# Patient Record
Sex: Male | Born: 1985 | Race: Black or African American | Hispanic: No | Marital: Married | Smoking: Current every day smoker
Health system: Southern US, Community
[De-identification: ages and names within clinical notes are randomized; demographics above are authoritative.]

## PROBLEM LIST (undated history)

## (undated) HISTORY — PX: APPENDECTOMY: SHX54

---

## 2020-06-27 ENCOUNTER — Emergency Department (HOSPITAL_COMMUNITY)
Admission: EM | Admit: 2020-06-27 | Discharge: 2020-06-28 | Disposition: A | Discharge status: Home / Self Care | Payer: BC Managed Care – PPO | Attending: Emergency Medicine | Admitting: Emergency Medicine

## 2020-06-27 ENCOUNTER — Emergency Department (HOSPITAL_COMMUNITY): Payer: BC Managed Care – PPO

## 2020-06-27 ENCOUNTER — Encounter (HOSPITAL_COMMUNITY): Payer: Self-pay | Admitting: Emergency Medicine

## 2020-06-27 DIAGNOSIS — M25511 Pain in right shoulder: Secondary | ICD-10-CM | POA: Diagnosis present

## 2020-06-27 DIAGNOSIS — Z5321 Procedure and treatment not carried out due to patient leaving prior to being seen by health care provider: Secondary | ICD-10-CM | POA: Diagnosis not present

## 2020-06-27 NOTE — ED Triage Notes (Signed)
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

## 2020-06-28 ENCOUNTER — Encounter (HOSPITAL_COMMUNITY): Payer: Self-pay | Admitting: *Deleted

## 2020-06-28 ENCOUNTER — Emergency Department (HOSPITAL_COMMUNITY)
Admission: EM | Admit: 2020-06-28 | Discharge: 2020-06-28 | Disposition: A | Discharge status: Home / Self Care | Payer: BC Managed Care – PPO | Source: Home / Self Care | Attending: Emergency Medicine | Admitting: Emergency Medicine

## 2020-06-28 ENCOUNTER — Other Ambulatory Visit: Payer: Self-pay

## 2020-06-28 DIAGNOSIS — M25511 Pain in right shoulder: Secondary | ICD-10-CM

## 2020-06-28 DIAGNOSIS — M79601 Pain in right arm: Secondary | ICD-10-CM | POA: Insufficient documentation

## 2020-06-28 DIAGNOSIS — F17200 Nicotine dependence, unspecified, uncomplicated: Secondary | ICD-10-CM | POA: Insufficient documentation

## 2020-06-28 MED ORDER — NAPROXEN 250 MG PO TABS: 500.0000 mg | ORAL_TABLET | Freq: Two times a day (BID) | ORAL | 0 refills | Status: DC

## 2020-06-28 MED ORDER — LIDOCAINE 5 % EX PTCH
1.0000 | MEDICATED_PATCH | CUTANEOUS | Status: DC
  Administered 2020-06-28: 1 via TRANSDERMAL
  Filled 2020-06-28: qty 1

## 2020-06-28 MED ORDER — NAPROXEN 500 MG PO TABS
500.0000 mg | ORAL_TABLET | Freq: Once | ORAL | Status: AC
  Administered 2020-06-28: 500 mg via ORAL
  Filled 2020-06-28: qty 1

## 2020-06-28 MED ORDER — NAPROXEN 250 MG PO TABS: 500.0000 mg | ORAL_TABLET | Freq: Two times a day (BID) | ORAL | 0 refills | Status: AC

## 2020-06-28 NOTE — Discharge Instructions (Signed)
Take naproxen twice daily as directed with food on your stomach, in addition to this you can use Tylenol 1000 mg every 8 hours.  You can also use over-the-counter salon pas Lidoderm patches, which can be worn for up to 12 hours and then should be removed for 12 hours.  If your symptoms or not improving over the next 5 to 7 days you should follow-up with orthopedics or sports medicine for further evaluation.  Avoid heavy lifting while at work.

## 2020-06-28 NOTE — ED Provider Notes (Signed)
[facility]-EMERGENCY DEPT Provider Note   CSN: [id number] Arrival date & time: 06/28/20  1218     History Chief Complaint  Patient presents with  . Shoulder Pain    right    Karma Hiney is a 34 y.o. male.  Jeremias Broyhill is a 34 y.o. male who is otherwise healthy, presents to the ED for evaluation of right shoulder pain.  He reports pain started on Monday and has been persistent over the past 3 days.  He denies any injury or trauma to the shoulder but does state that he does a lot of heavy lifting at work and thinks he may have injured it this way.  He states pain is worse when he tries to lift the shoulder overhead, and it is often worse at night when he is sleeping.  He denies any pain radiating into the arm, reports pain is located over the front of the shoulder, no associated neck pain.  No numbness tingling or weakness in the arm.  No pain at the wrist or elbow.  He has not taken anything to treat symptoms.  Denies history of previous injury or surgery to the right shoulder.  No other aggravating or alleviating factors.        History reviewed. No pertinent past medical history.  There are no problems to display for this patient.   Past Surgical History:  Procedure Laterality Date  . APPENDECTOMY         No family history on file.  Social History   Tobacco Use  . Smoking status: Current Every Day Smoker  . Smokeless tobacco: Never Used  Substance Use Topics  . Alcohol use: Not Currently  . Drug use: Not on file    Home Medications Prior to Admission medications   Medication Sig Start Date End Date Taking? Authorizing Provider  naproxen (NAPROSYN) 250 MG tablet Take 2 tablets (500 mg total) by mouth 2 (two) times daily with a meal for 14 days. 06/28/20 07/12/20  Dartha Lodge, PA-C    Allergies    Patient has no known allergies.  Review of Systems   Review of Systems  Constitutional: Negative for chills and fever.  Musculoskeletal:  Positive for arthralgias. Negative for joint swelling.  Skin: Negative for color change and rash.  Neurological: Negative for weakness and numbness.    Physical Exam Updated Vital Signs BP 116/76 (BP Location: Left Arm)   Pulse 94   Temp 98.5 F (36.9 C) (Oral)   Resp 18   SpO2 94%   Physical Exam Vitals and nursing note reviewed.  Constitutional:      General: He is not in acute distress.    Appearance: Normal appearance. He is well-developed and normal weight. He is not ill-appearing or diaphoretic.  HENT:     Head: Normocephalic and atraumatic.  Eyes:     General:        Right eye: No discharge.        Left eye: No discharge.  Neck:     Comments: No midline or paraspinal tenderness Pulmonary:     Effort: Pulmonary effort is normal. No respiratory distress.  Musculoskeletal:     Cervical back: Neck supple. No tenderness.     Comments: Tenderness to palpation over the anterior portion of the right shoulder without palpable deformity, swelling or overlying skin changes.  No tenderness over the trapezius, deltoid or extending down into the arm.  Patient has normal range of motion at the elbow and  wrist.  Range of motion of the shoulder limited by pain, primarily when reaching overhead, but he has full passive range of motion.  Distal pulses 2+, normal strength and sensation.  Skin:    General: Skin is warm and dry.  Neurological:     Mental Status: He is alert and oriented to person, place, and time.     Coordination: Coordination normal.  Psychiatric:        Mood and Affect: Mood normal.        Behavior: Behavior normal.     ED Results / Procedures / Treatments   Labs (all labs ordered are listed, but only abnormal results are displayed) Labs Reviewed - No data to display  EKG None  Radiology DG Shoulder Right  Result Date: 06/27/2020 CLINICAL DATA:  History of prior injury with waxing and waning pain, initial encounter EXAM: RIGHT SHOULDER - 2+ VIEW COMPARISON:   None. FINDINGS: No acute fracture or dislocation is noted. No soft tissue abnormality is seen. Underlying bony thorax appears within normal limits. IMPRESSION: No acute abnormality noted. Electronically Signed   By: Alcide Clever M.D.   On: 06/27/2020 23:20    Procedures Procedures (including critical care time)  Medications Ordered in ED Medications  lidocaine (LIDODERM) 5 % 1 patch (1 patch Transdermal Patch Applied 06/28/20 1314)  naproxen (NAPROSYN) tablet 500 mg (500 mg Oral Given 06/28/20 1314)    ED Course  I have reviewed the triage vital signs and the nursing notes.  Pertinent labs & imaging results that were available during my care of the patient were reviewed by me and considered in my medical decision making (see chart for details).    MDM Rules/Calculators/A&P                          34 year old male presents with right shoulder pain, no specific trauma or injury but does a lot of heavy lifting at work, came to [facility] and had an x-ray done but did not stay for full evaluation, x-rays reviewed, no evidence of fracture or dislocation.  Patient has no deformity to the shoulder today and is neurovascularly intact.  Suspect rotator cuff tendinopathy.  Will have patient treat with NSAIDs and Lidoderm patches and avoid heavy lifting and have him follow-up with orthopedics or sports medicine if symptoms or not improving.  Patient expresses understanding and agreement.  Discharged home in good condition.  Final Clinical Impression(s) / ED Diagnoses Final diagnoses:  Acute pain of right shoulder    Rx / DC Orders ED Discharge Orders         Ordered    naproxen (NAPROSYN) 250 MG tablet  2 times daily with meals     Discontinue  Reprint     06/28/20 1314           Dartha Lodge, [place] 06/28/20 1315    Linwood Dibbles, MD 06/29/20 1002

## 2020-06-28 NOTE — ED Triage Notes (Signed)
Pt complains of right shoulder pain x 3 days. Denies injury. States he lifts heavy things at work.

## 2020-10-27 ENCOUNTER — Emergency Department (HOSPITAL_COMMUNITY)
Admission: EM | Admit: 2020-10-27 | Discharge: 2020-10-27 | Disposition: A | Discharge status: Home / Self Care | Payer: BC Managed Care – PPO | Attending: Emergency Medicine | Admitting: Emergency Medicine

## 2020-10-27 ENCOUNTER — Other Ambulatory Visit: Payer: Self-pay

## 2020-10-27 ENCOUNTER — Encounter (HOSPITAL_COMMUNITY): Payer: Self-pay | Admitting: Emergency Medicine

## 2020-10-27 DIAGNOSIS — K0889 Other specified disorders of teeth and supporting structures: Secondary | ICD-10-CM | POA: Insufficient documentation

## 2020-10-27 DIAGNOSIS — F17200 Nicotine dependence, unspecified, uncomplicated: Secondary | ICD-10-CM | POA: Diagnosis not present

## 2020-10-27 MED ORDER — PENICILLIN V POTASSIUM 500 MG PO TABS: 500.0000 mg | ORAL_TABLET | Freq: Four times a day (QID) | ORAL | 0 refills | Status: AC

## 2020-10-27 NOTE — ED Provider Notes (Signed)
[facility]-EMERGENCY DEPT Provider Note   CSN: [id number] Arrival date & time: 10/27/20  1659     History Chief Complaint  Patient presents with   Dental Pain    Jeffery Mills is a 34 y.o. male.  HPI   Patient with no significant medical history presents to the emergency department with chief complaint of right-sided lower jaw dental pain.  Patient states pain started  2 days ago and has been getting worse. Patient noted a small bump in the back of his mouth and that it was painful when he touches it.  He states he is not seen a dentist in a long time,   concerned that he might have a abscess in his mouth.  He denies fevers, chills, tongue or throat swelling, difficulty swallowing or breathing.  He is not immunocompromise has not taken any antibiotics for the for this.  He has been taking Tylenol and ibuprofen without relief.  Patient denies alleviating factors at this time.  Patient denies headaches, ear pain, trismus or torticollis,  fevers, chills, shortness of breath, chest pain, abdominal pain, nausea vomiting diarrhea, pedal edema.  History reviewed. No pertinent past medical history.  There are no problems to display for this patient.   Past Surgical History:  Procedure Laterality Date   APPENDECTOMY         No family history on file.  Social History   Tobacco Use   Smoking status: Current Every Day Smoker   Smokeless tobacco: Never Used  Substance Use Topics   Alcohol use: Not Currently   Drug use: Not on file    Home Medications Prior to Admission medications   Medication Sig Start Date End Date Taking? Authorizing Provider  penicillin v potassium (VEETID) 500 MG tablet Take 1 tablet (500 mg total) by mouth 4 (four) times daily for 7 days. 10/27/20 11/03/20  Carroll Sage, PA-C    Allergies    Patient has no known allergies.  Review of Systems   Review of Systems  Constitutional: Negative for chills and fever.  HENT:  Positive for dental problem. Negative for congestion, ear discharge and sore throat.   Respiratory: Negative for shortness of breath.   Cardiovascular: Negative for chest pain.  Gastrointestinal: Negative for abdominal pain, diarrhea, nausea and vomiting.  Genitourinary: Negative for enuresis.  Musculoskeletal: Negative for back pain.  Skin: Negative for rash.  Neurological: Negative for dizziness and headaches.  Hematological: Does not bruise/bleed easily.    Physical Exam Updated Vital Signs BP 126/81 (BP Location: Left Arm)    Pulse 86    Temp 99.7 F (37.6 C) (Oral)    Resp 16    SpO2 100%   Physical Exam Vitals and nursing note reviewed.  Constitutional:      General: He is not in acute distress.    Appearance: He is not ill-appearing.  HENT:     Head: Normocephalic and atraumatic.     Nose: No congestion.     Mouth/Throat:     Mouth: Mucous membranes are moist.     Pharynx: No oropharyngeal exudate or posterior oropharyngeal erythema.     Comments: Oropharynx was visualized tongue and uvula are both midline, he had noted multiple cavities throughout his molars.  Patient's right lower molar has some erythema noted, no notable abscess.  Gumline was palpated no fluctuance or indurations felt.  No trismus noted Eyes:     Conjunctiva/sclera: Conjunctivae normal.  Neck:     Comments: No torticollis Cardiovascular:  Rate and Rhythm: Normal rate and regular rhythm.     Pulses: Normal pulses.     Heart sounds: No murmur heard.  No friction rub. No gallop.   Pulmonary:     Effort: Pulmonary effort is normal.  Musculoskeletal:     Cervical back: Normal range of motion and neck supple.     Comments: Patient moving all 4 extremities out difficulty  Skin:    General: Skin is warm and dry.  Neurological:     Mental Status: He is alert.     Comments: No difficulty with word finding  Psychiatric:        Mood and Affect: Mood normal.     ED Results / Procedures / Treatments    Labs (all labs ordered are listed, but only abnormal results are displayed) Labs Reviewed - No data to display  EKG None  Radiology No results found.  Procedures Procedures (including critical care time)  Medications Ordered in ED Medications - No data to display  ED Course  I have reviewed the triage vital signs and the nursing notes.  Pertinent labs & imaging results that were available during my care of the patient were reviewed by me and considered in my medical decision making (see chart for details).    MDM Rules/Calculators/A&P                          Patient presents with dental pain.  He is alert, does not appear in acute distress, vital signs reassuring.  Due to well-appearing patient, benign physical exam further lab work and imaging not warranted at this time.  I have low suspicion for peritonsillar abscess, retropharyngeal abscess, or Ludwig angina as oropharynx was visualized tongue and uvula were both midline, there is no exudates, erythema or edema noted in the posterior pillars or on/ around tonsils.  Low suspicion for an abscess as gumline were palpated no fluctuance or induration felt.  Low suspicion for periorbital or orbital cellulitis as patient face had no erythematous.  Suspect patient suffering from dental cavity, will start him on antibiotics and refer him to a dentist for further evaluation.  Vital signs have remained stable, no indication for hospital admission.Patient given at home care as well strict return precautions.  Patient verbalized that they understood agreed to said plan.   Final Clinical Impression(s) / ED Diagnoses Final diagnoses:  Pain, dental    Rx / DC Orders ED Discharge Orders         Ordered    penicillin v potassium (VEETID) 500 MG tablet  4 times daily        10/27/20 1828           Carroll Sage, PA-C 10/27/20 1937    Terald Sleeper, MD 10/27/20 2239

## 2020-10-27 NOTE — Discharge Instructions (Signed)
Seen here for dental pain.  Exam looks reassuring.  Starting on antibiotics please take as prescribed.  May take over-the-counter pain medications like ibuprofen or Tylenol every 6 hours as needed please follow dosing the back of bottle.  Recommend that you follow-up with a dentist for further evaluation.  Come back to the emergency department if you develop chest pain, shortness of breath, severe abdominal pain, uncontrolled nausea, vomiting, diarrhea.

## 2020-10-27 NOTE — ED Triage Notes (Signed)
Patient c/o right lower dental pain x2 days.

## 2021-07-05 ENCOUNTER — Emergency Department (HOSPITAL_COMMUNITY): Payer: Self-pay

## 2021-07-05 ENCOUNTER — Other Ambulatory Visit: Payer: Self-pay

## 2021-07-05 ENCOUNTER — Encounter (HOSPITAL_COMMUNITY): Payer: Self-pay

## 2021-07-05 ENCOUNTER — Emergency Department (HOSPITAL_COMMUNITY)
Admission: EM | Admit: 2021-07-05 | Discharge: 2021-07-06 | Disposition: A | Discharge status: Home / Self Care | Payer: Self-pay | Attending: Emergency Medicine | Admitting: Emergency Medicine

## 2021-07-05 DIAGNOSIS — R1030 Lower abdominal pain, unspecified: Secondary | ICD-10-CM | POA: Insufficient documentation

## 2021-07-05 DIAGNOSIS — F17200 Nicotine dependence, unspecified, uncomplicated: Secondary | ICD-10-CM | POA: Insufficient documentation

## 2021-07-05 DIAGNOSIS — R109 Unspecified abdominal pain: Secondary | ICD-10-CM

## 2021-07-05 DIAGNOSIS — N50811 Right testicular pain: Secondary | ICD-10-CM | POA: Insufficient documentation

## 2021-07-05 LAB — CBC WITH DIFFERENTIAL/PLATELET
Abs Immature Granulocytes: 0.02 10*3/uL (ref 0.00–0.07)
Basophils Absolute: 0 10*3/uL (ref 0.0–0.1)
Basophils Relative: 1 %
Eosinophils Absolute: 0.2 10*3/uL (ref 0.0–0.5)
Eosinophils Relative: 2 %
HCT: 40.6 % (ref 39.0–52.0)
Hemoglobin: 13.7 g/dL (ref 13.0–17.0)
Immature Granulocytes: 0 %
Lymphocytes Relative: 21 %
Lymphs Abs: 1.7 10*3/uL (ref 0.7–4.0)
MCH: 31.4 pg (ref 26.0–34.0)
MCHC: 33.7 g/dL (ref 30.0–36.0)
MCV: 92.9 fL (ref 80.0–100.0)
Monocytes Absolute: 0.7 10*3/uL (ref 0.1–1.0)
Monocytes Relative: 8 %
Neutro Abs: 5.6 10*3/uL (ref 1.7–7.7)
Neutrophils Relative %: 68 %
Platelets: 167 10*3/uL (ref 150–400)
RBC: 4.37 MIL/uL (ref 4.22–5.81)
RDW: 14.1 % (ref 11.5–15.5)
WBC: 8.3 10*3/uL (ref 4.0–10.5)
nRBC: 0 % (ref 0.0–0.2)

## 2021-07-05 LAB — COMPREHENSIVE METABOLIC PANEL
ALT: 18 U/L (ref 0–44)
AST: 25 U/L (ref 15–41)
Albumin: 4.5 g/dL (ref 3.5–5.0)
Alkaline Phosphatase: 55 U/L (ref 38–126)
Anion gap: 6 (ref 5–15)
BUN: 16 mg/dL (ref 6–20)
CO2: 28 mmol/L (ref 22–32)
Calcium: 9.5 mg/dL (ref 8.9–10.3)
Chloride: 104 mmol/L (ref 98–111)
Creatinine, Ser: 1.02 mg/dL (ref 0.61–1.24)
GFR, Estimated: >60 mL/min (ref >60)
Glucose, Bld: 108 mg/dL — ABNORMAL HIGH (ref 70–99)
Potassium: 4.1 mmol/L (ref 3.5–5.1)
Sodium: 138 mmol/L (ref 135–145)
Total Bilirubin: 0.6 mg/dL (ref 0.3–1.2)
Total Protein: 7.6 g/dL (ref 6.5–8.1)

## 2021-07-05 LAB — URINALYSIS, ROUTINE W REFLEX MICROSCOPIC
Bacteria, UA: NONE SEEN
Bilirubin Urine: NEGATIVE
Glucose, UA: NEGATIVE mg/dL
Ketones, ur: NEGATIVE mg/dL
Leukocytes,Ua: NEGATIVE
Nitrite: NEGATIVE
Protein, ur: NEGATIVE mg/dL
Specific Gravity, Urine: 1.023 (ref 1.005–1.030)
pH: 6 (ref 5.0–8.0)

## 2021-07-05 LAB — LIPASE, BLOOD: Lipase: 23 U/L (ref 11–51)

## 2021-07-05 NOTE — ED Provider Notes (Signed)
Emergency Medicine Provider Triage Evaluation Note  Jeffery Mills , a 35 y.o. male  was evaluated in triage.  Pt complains of R testicle pain and R groin pain since this morning. Was lifting heavy objects yesterday, no penile lesions, discharge. No urinary symptoms. No nausea, vomting, fevers.   Review of Systems  Positive: Testicle pain Negative: No nausea, vomting, fevers.   Physical Exam  BP 123/80 (BP Location: Left Arm)   Pulse 76   Temp 98.3 F (36.8 C) (Oral)   Resp 18   SpO2 99%  Gen:   Awake, no distress   Resp:  Normal effort  MSK:   Moves extremities without difficulty  Other:  L testicle slighty tender to touch, not swollen   Medical Decision Making  Medically screening exam initiated at 5:31 PM.  Appropriate orders placed.  Salvatore Shear was informed that the remainder of the evaluation will be completed by another provider, this initial triage assessment does not replace that evaluation, and the importance of remaining in the ED until their evaluation is complete.     Farrel Gordon, PA-C 07/05/21 1732    Alvira Monday, MD 07/07/21 [phone]

## 2021-07-05 NOTE — ED Triage Notes (Signed)
Right groin and scrotum pain x 1 day with increased pain to touch. Pt reports lifting mattress yesterday. Hx of appendicectomy. Denies n/v/fever

## 2021-07-06 ENCOUNTER — Emergency Department (HOSPITAL_COMMUNITY): Payer: Self-pay

## 2021-07-06 MED ORDER — ONDANSETRON HCL 4 MG/2ML IJ SOLN
4.0000 mg | Freq: Once | INTRAMUSCULAR | Status: AC
  Administered 2021-07-06: 4 mg via INTRAVENOUS
  Filled 2021-07-06: qty 2

## 2021-07-06 MED ORDER — KETOROLAC TROMETHAMINE 30 MG/ML IJ SOLN
30.0000 mg | Freq: Once | INTRAMUSCULAR | Status: AC
  Administered 2021-07-06: 30 mg via INTRAVENOUS
  Filled 2021-07-06: qty 1

## 2021-07-06 NOTE — ED Provider Notes (Signed)
[facility]-EMERGENCY DEPT Provider Note   CSN: [id number] Arrival date & time: 07/05/21  1701     History Chief Complaint  Patient presents with   Testicle Pain    Rio Kidane is a 35 y.o. male.  Patient presents to the emergency department with a chief complaint of right-sided abdominal and right-sided groin pain.  States symptoms started yesterday morning.  He reports that the symptoms wax and wane.  He denies any dysuria, hematuria, penile discharge.  He denies any nausea, vomiting, fevers, or chills.  He states that the pain seems to radiate from his abdomen down into his groin and testicle.  Denies any history of kidney stones.  Rates his pain as moderate.  The history is provided by the patient. No language interpreter was used.      History reviewed. No pertinent past medical history.  There are no problems to display for this patient.   Past Surgical History:  Procedure Laterality Date   APPENDECTOMY         History reviewed. No pertinent family history.  Social History   Tobacco Use   Smoking status: Every Day   Smokeless tobacco: Never  Substance Use Topics   Alcohol use: Not Currently    Home Medications Prior to Admission medications   Not on File    Allergies    Patient has no known allergies.  Review of Systems   Review of Systems  All other systems reviewed and are negative.  Physical Exam Updated Vital Signs BP 127/90   Pulse 66   Temp 98.3 F (36.8 C) (Oral)   Resp 16   SpO2 100%   Physical Exam Vitals and nursing note reviewed.  Constitutional:      General: He is not in acute distress.    Appearance: He is well-developed. He is not ill-appearing.  HENT:     Head: Normocephalic and atraumatic.  Eyes:     Conjunctiva/sclera: Conjunctivae normal.  Cardiovascular:     Rate and Rhythm: Normal rate.  Pulmonary:     Effort: Pulmonary effort is normal. No respiratory distress.  Abdominal:     General:  There is no distension.  Musculoskeletal:     Cervical back: Neck supple.     Comments: Moves all extremities  Skin:    General: Skin is warm and dry.  Neurological:     Mental Status: He is alert and oriented to person, place, and time.  Psychiatric:        Mood and Affect: Mood normal.        Behavior: Behavior normal.    ED Results / Procedures / Treatments   Labs (all labs ordered are listed, but only abnormal results are displayed) Labs Reviewed  COMPREHENSIVE METABOLIC PANEL - Abnormal; Notable for the following components:      Result Value   Glucose, Bld 108 (*)    All other components within normal limits  URINALYSIS, ROUTINE W REFLEX MICROSCOPIC - Abnormal; Notable for the following components:   Hgb urine dipstick SMALL (*)    All other components within normal limits  CBC WITH DIFFERENTIAL/PLATELET  LIPASE, BLOOD  GC/CHLAMYDIA PROBE AMP () NOT AT [facility]    EKG None  Radiology US SCROTUM W/DOPPLER  Result Date: 07/05/2021 CLINICAL DATA:  Testicular pain EXAM: SCROTAL ULTRASOUND DOPPLER ULTRASOUND OF THE TESTICLES TECHNIQUE: Complete ultrasound examination of the testicles, epididymis, and other scrotal structures was performed. Color and spectral Doppler ultrasound were also utilized to evaluate blood  flow to the testicles. COMPARISON:  None. FINDINGS: Right testicle Measurements: 4.3 x 1.8 x 3.0 cm. No mass or microlithiasis visualized. Left testicle Measurements: 4.1 x 1.7 x 2.7 cm. No mass or microlithiasis visualized. Right epididymis:  Normal in size and appearance. Left epididymis:  Normal in size and appearance. Hydrocele:  Small right hydrocele Varicocele:  None visualized. Pulsed Doppler interrogation of both testes demonstrates normal low resistance arterial and venous waveforms bilaterally. IMPRESSION: No testicular abnormality or evidence of torsion. Small right hydrocele. Electronically Signed   By: Charlett Nose M.D.   On: 07/05/2021 18:54     Procedures Procedures   Medications Ordered in ED Medications  ketorolac (TORADOL) 30 MG/ML injection 30 mg (has no administration in time range)  ondansetron (ZOFRAN) injection 4 mg (has no administration in time range)    ED Course  I have reviewed the triage vital signs and the nursing notes.  Pertinent labs & imaging results that were available during my care of the patient were reviewed by me and considered in my medical decision making (see chart for details).    MDM Rules/Calculators/A&P                           Patient here with right-sided abdominal pain.  Pain radiates into his scrotum.  Patient seen by provider in triage.  Had ultrasound done in triage showed no evidence of testicular torsion.  There was a small right-sided hydrocele.  Urinalysis shows few red blood cells and small hemoglobin, question kidney stone.  Will check CT renal.  No leukocytosis, normal creatinine.  CT renal shows no acute abnormalities.  Patient feels somewhat improved.  The vital signs are stable.  He is nontoxic in appearance.  Do not feel that further emergent work-up is indicated.  We will have patient follow-up with his doctor for further outpatient work-up.  Patient understands agrees with the plan.  He is stable ready for discharge. Final Clinical Impression(s) / ED Diagnoses Final diagnoses:  Flank pain    Rx / DC Orders ED Discharge Orders     None        Roxy Horseman, PA-C 07/06/21 0214    Jacalyn Lefevre, MD 07/06/21 [phone]

## 2021-07-08 LAB — GC/CHLAMYDIA PROBE AMP (~~LOC~~) NOT AT ARMC
Chlamydia: NEGATIVE
Comment: NEGATIVE
Comment: NORMAL
Neisseria Gonorrhea: NEGATIVE

## 2022-02-04 IMAGING — US US SCROTUM W/ DOPPLER COMPLETE
1 series · 15 of 25 positions shown · non-contrast
Comparison: None.

CLINICAL DATA: Testicular pain

EXAM:
SCROTAL ULTRASOUND
DOPPLER ULTRASOUND OF THE TESTICLES
TECHNIQUE: Complete ultrasound examination of the testicles, epididymis, and
other scrotal structures was performed. Color and spectral Doppler
ultrasound were also utilized to evaluate blood flow to the
testicles.

[Series 1: us art/ven flow abd pelv doppl mc & wl · 59 acquisitions, 15 frames shown]
[im 1/59]
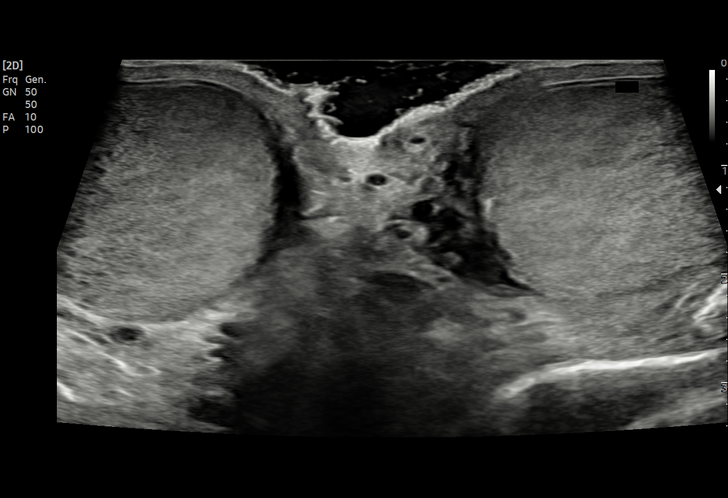
[im 5/59]
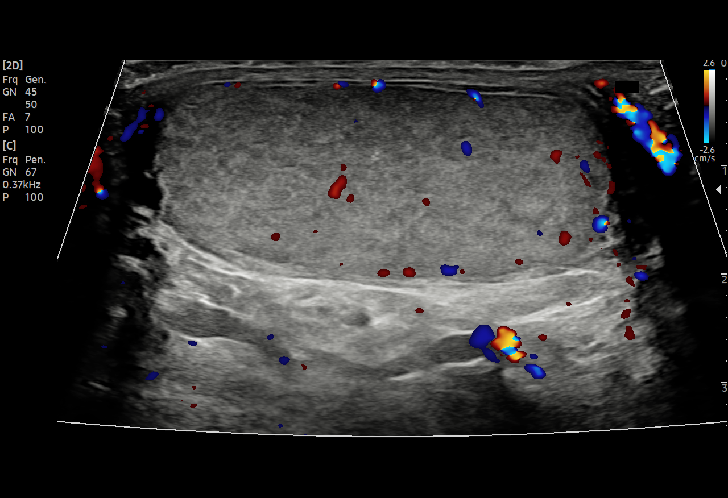
[im 10/59]
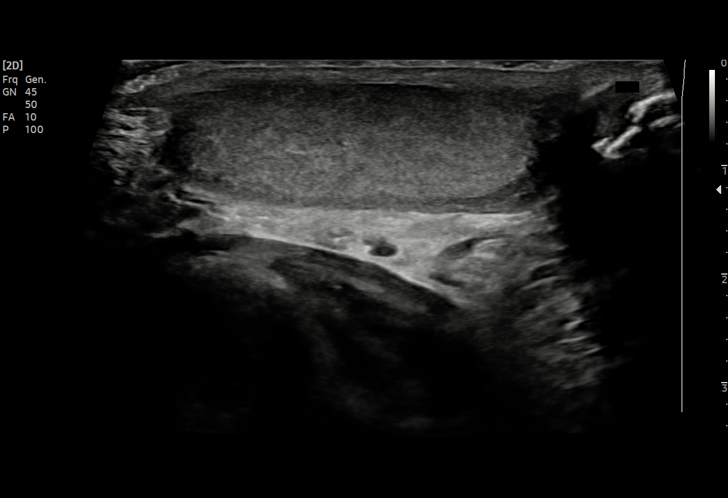
[im 13/59]
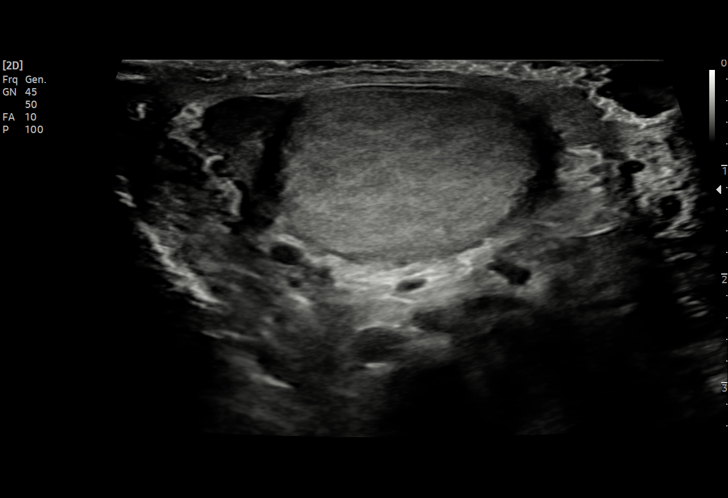
[im 17/59]
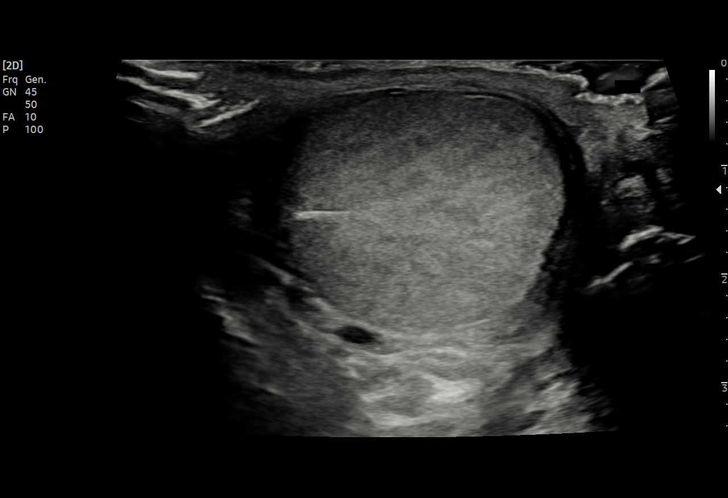
[im 22/59]
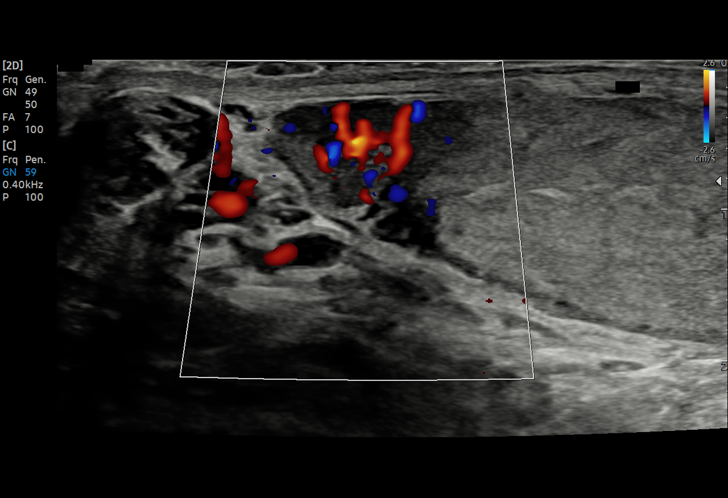
[im 25/59]
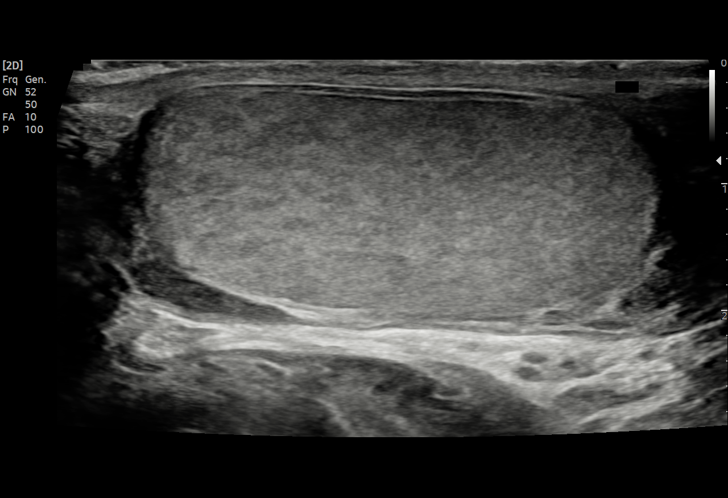
[im 30/59]
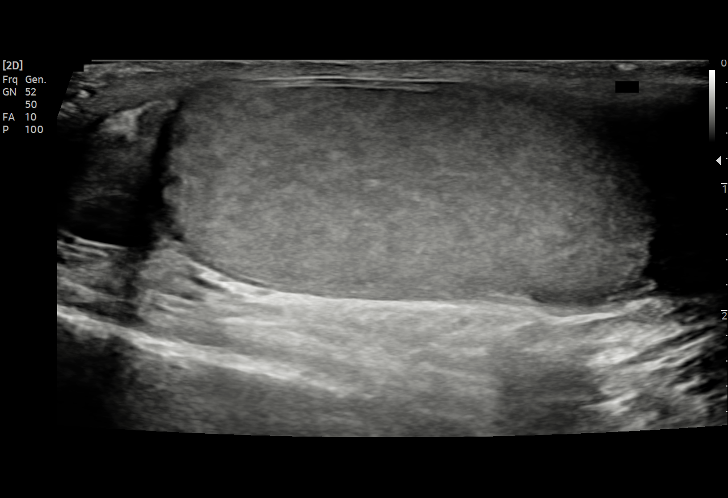
[im 34/59]
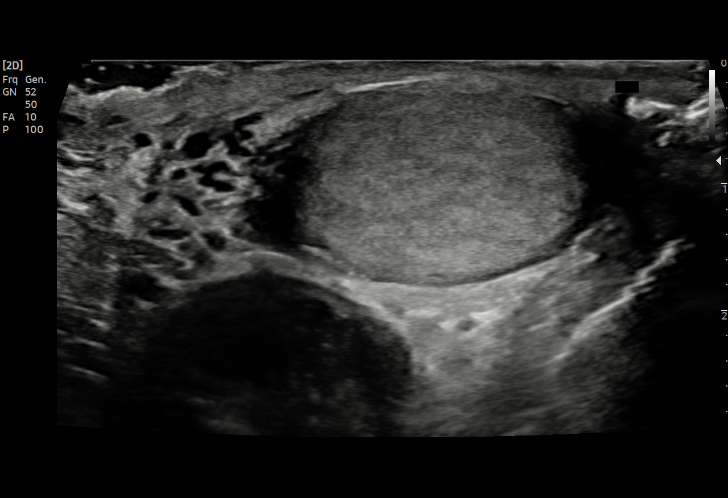
[im 37/59]
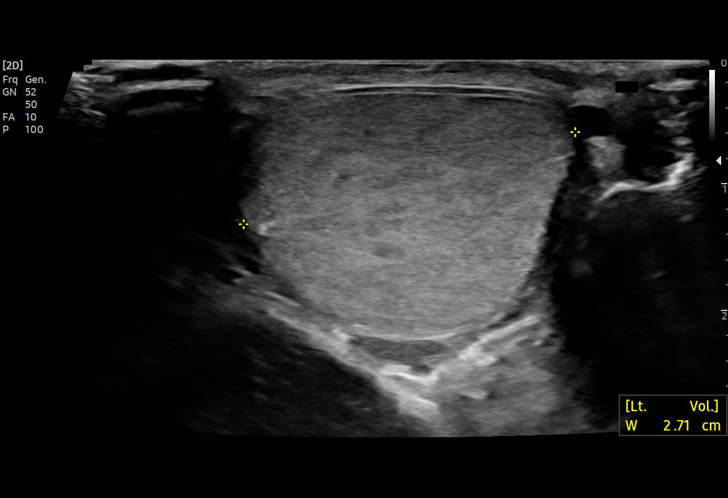
[im 42/59]
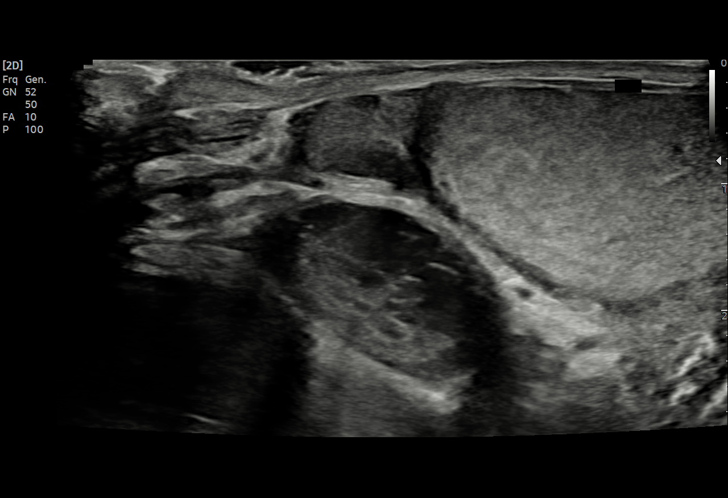
[im 46/59]
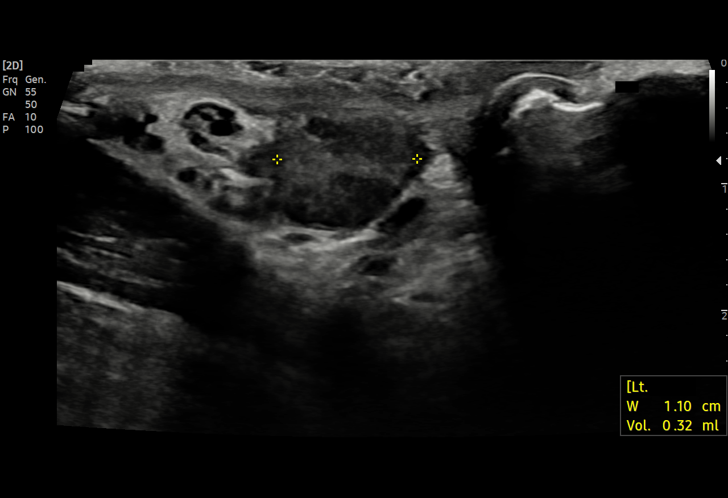
[im 49/59]
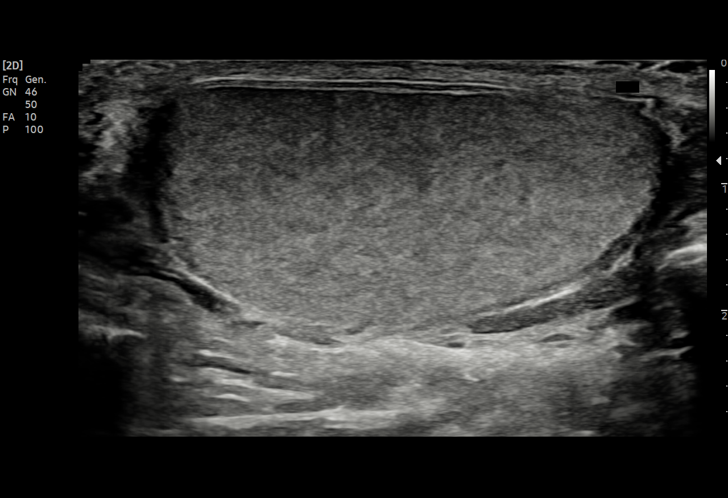
[im 54/59]
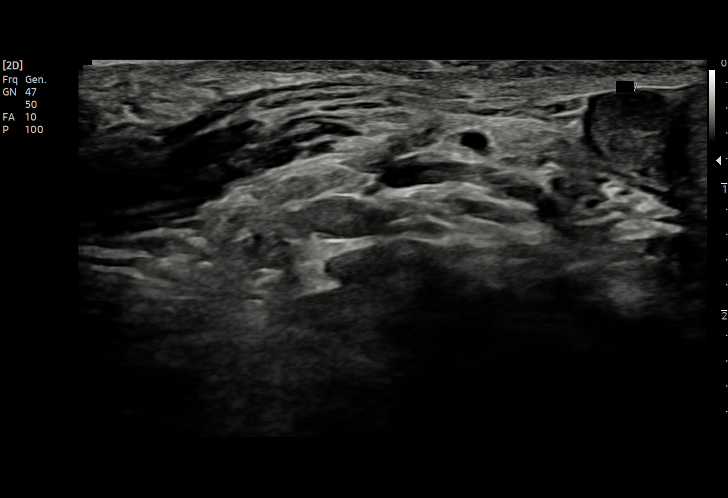
[im 59/59]
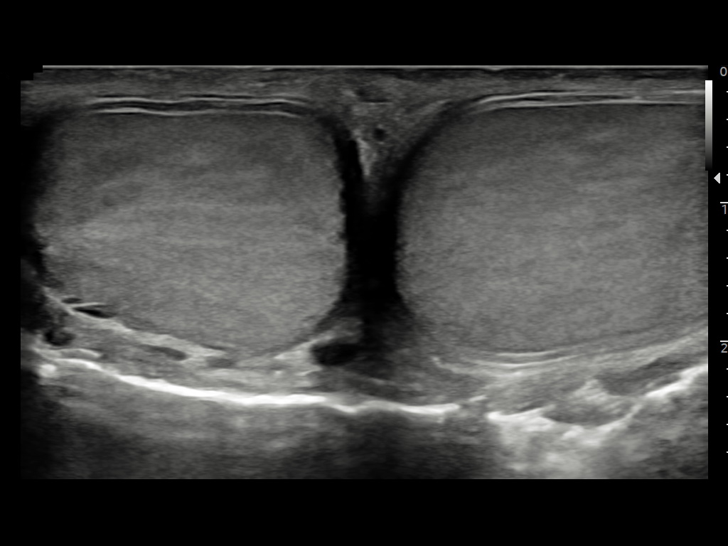

[15 of 25 positions shown; findings below may reference images not displayed]

FINDINGS: Right testicle

Measurements: 4.3 x 1.8 x 3.0 cm. No mass or microlithiasis
visualized.

Left testicle

Measurements: 4.1 x 1.7 x 2.7 cm. No mass or microlithiasis
visualized.

Right epididymis:  Normal in size and appearance.

Left epididymis:  Normal in size and appearance.

Hydrocele:  Small right hydrocele

Varicocele:  None visualized.

Pulsed Doppler interrogation of both testes demonstrates normal low
resistance arterial and venous waveforms bilaterally.
IMPRESSION: No testicular abnormality or evidence of torsion.

Small right hydrocele.

## 2023-11-30 ENCOUNTER — Emergency Department (HOSPITAL_BASED_OUTPATIENT_CLINIC_OR_DEPARTMENT_OTHER)
Admission: EM | Admit: 2023-11-30 | Discharge: 2023-11-30 | Disposition: A | Discharge status: Home / Self Care | Payer: Self-pay | Source: Home / Self Care

## 2023-11-30 ENCOUNTER — Other Ambulatory Visit: Payer: Self-pay

## 2024-04-02 ENCOUNTER — Encounter (HOSPITAL_BASED_OUTPATIENT_CLINIC_OR_DEPARTMENT_OTHER): Payer: Self-pay

## 2024-04-02 ENCOUNTER — Other Ambulatory Visit: Payer: Self-pay

## 2024-04-02 ENCOUNTER — Emergency Department (HOSPITAL_BASED_OUTPATIENT_CLINIC_OR_DEPARTMENT_OTHER)
Admission: EM | Admit: 2024-04-02 | Discharge: 2024-04-02 | Disposition: A | Discharge status: Home / Self Care | Payer: Self-pay | Attending: Emergency Medicine | Admitting: Emergency Medicine

## 2024-04-02 DIAGNOSIS — M436 Torticollis: Secondary | ICD-10-CM | POA: Insufficient documentation

## 2024-04-02 MED ORDER — CYCLOBENZAPRINE HCL 10 MG PO TABS: 5.0000 mg | ORAL_TABLET | Freq: Two times a day (BID) | ORAL | 0 refills | Status: DC | PRN

## 2024-04-02 MED ORDER — NAPROXEN 250 MG PO TABS
500.0000 mg | ORAL_TABLET | Freq: Once | ORAL | Status: AC
  Administered 2024-04-02: 500 mg via ORAL
  Filled 2024-04-02: qty 2

## 2024-04-02 MED ORDER — NAPROXEN 375 MG PO TABS: 375.0000 mg | ORAL_TABLET | Freq: Two times a day (BID) | ORAL | 0 refills | Status: DC

## 2024-04-02 MED ORDER — CYCLOBENZAPRINE HCL 5 MG PO TABS
5.0000 mg | ORAL_TABLET | Freq: Once | ORAL | Status: AC
  Administered 2024-04-02: 5 mg via ORAL
  Filled 2024-04-02: qty 1

## 2024-04-02 MED ORDER — NAPROXEN 375 MG PO TABS: 375.0000 mg | ORAL_TABLET | Freq: Two times a day (BID) | ORAL | 0 refills | Status: AC

## 2024-04-02 MED ORDER — LIDOCAINE 5 % EX PTCH
1.0000 | MEDICATED_PATCH | CUTANEOUS | Status: DC
  Administered 2024-04-02: 1 via TRANSDERMAL
  Filled 2024-04-02: qty 1

## 2024-04-02 MED ORDER — CYCLOBENZAPRINE HCL 10 MG PO TABS: 5.0000 mg | ORAL_TABLET | Freq: Two times a day (BID) | ORAL | 0 refills | Status: AC | PRN

## 2024-04-02 NOTE — Discharge Instructions (Addendum)
Contact a health care provider if: You have a fever. Your symptoms do not improve or they get worse. Get help right away if: You have trouble breathing. You make loud, high-pitched sounds when you breathe, most often when you breathe in (stridor). You start to drool. You have trouble swallowing or pain when swallowing. You develop numbness or weakness in your hands or feet. You have changes in your speech, understanding, or vision. You are in severe pain. You cannot move your head or neck. These symptoms may represent a serious problem that is an emergency. Do not wait to see if the symptoms will go away. Get medical help right away. Call your local emergency services (911 in the [place] Do not drive yourself to the hospital.

## 2024-04-02 NOTE — ED Triage Notes (Signed)
 Pt start feeling nod on top of right shoulder Monday last week, painful, felt like a cramp. 8 out of 10.  Aox4, denies dizziness, worsening with turning of the head.

## 2024-04-02 NOTE — ED Provider Notes (Incomplete)
 [place] EMERGENCY DEPARTMENT AT [facility] Provider Note   CSN: [id number] Arrival date & time: 04/02/24  2010     History {Add pertinent medical, surgical, social history, OB history to HPI:1} Chief Complaint  Patient presents with  . Shoulder Pain    [facility] [place] is a 38 y.o. male  who present with left neck and shoulder pain.  Patient reports that this has been ongoing about a week.  He states that he just woke up with the pain.  He has been trying over-the-counter medications without much relief.  Patient reports that when he is in the car and has to turn his neck over his right shoulder is to turn his whole torso.  He denies any numbness tingling or weakness he has no known injuries.  The history is provided by the patient.  Shoulder Pain      Home Medications Prior to Admission medications   Not on File      Allergies    Patient has no known allergies.    Review of Systems   Review of Systems  Physical Exam Updated Vital Signs BP (!) 134/94 (BP Location: Right Arm)   Pulse 81   Temp 97.9 F (36.6 C)   Resp 16   Ht 6' (1.829 m)   Wt 63.5 kg   SpO2 99%   BMI 18.99 kg/m  Physical Exam Vitals and nursing note reviewed.  Constitutional:      General: He is not in acute distress.    Appearance: He is well-developed. He is not diaphoretic.  HENT:     Head: Normocephalic and atraumatic.  Eyes:     General: No scleral icterus.    Conjunctiva/sclera: Conjunctivae normal.  Cardiovascular:     Rate and Rhythm: Normal rate and regular rhythm.     Heart sounds: Normal heart sounds.  Pulmonary:     Effort: Pulmonary effort is normal. No respiratory distress.     Breath sounds: Normal breath sounds.  Abdominal:     Palpations: Abdomen is soft.     Tenderness: There is no abdominal tenderness.  Musculoskeletal:     Cervical back: Normal range of motion and neck supple.  Skin:    General: Skin is warm and dry.  Neurological:     Mental Status: He  is alert.  Psychiatric:        Behavior: Behavior normal.     ED Results / Procedures / Treatments   Labs (all labs ordered are listed, but only abnormal results are displayed) Labs Reviewed - No data to display  EKG None  Radiology No results found.  Procedures Procedures  {Document cardiac monitor, telemetry assessment procedure when appropriate:1}  Medications Ordered in ED Medications  lidocaine  (LIDODERM ) 5 % 1 patch (has no administration in time range)    ED Course/ Medical Decision Making/ A&P   {   Click here for ABCD2, HEART and other calculatorsREFRESH Note before signing :1}                              Medical Decision Making Risk Prescription drug management.   ***  {Document critical care time when appropriate:1} {Document review of labs and clinical decision tools ie heart score, Chads2Vasc2 etc:1}  {Document your independent review of radiology images, and any outside records:1} {Document your discussion with family members, caretakers, and with consultants:1} {Document social determinants of health affecting pt's care:1} {Document your decision making why  or why not admission, treatments were needed:1} Final Clinical Impression(s) / ED Diagnoses Final diagnoses:  None    Rx / DC Orders ED Discharge Orders     None

## 2024-04-02 NOTE — ED Provider Notes (Signed)
  EMERGENCY DEPARTMENT AT [facility] Provider Note   CSN: [id number] Arrival date & time: 04/02/24  2010     History  Chief Complaint  Patient presents with   Shoulder Pain    Jeffery Mills is a 38 y.o. male  who present with left neck and shoulder pain.  Patient reports that this has been ongoing about a week.  He states that he just woke up with the pain.  He has been trying over-the-counter medications without much relief.  Patient reports that when he is in the car and has to turn his neck over his right shoulder is to turn his whole torso.  He denies any numbness tingling or weakness he has no known injuries.  The history is provided by the patient.  Shoulder Pain      Home Medications Prior to Admission medications   Not on File      Allergies    Patient has no known allergies.    Review of Systems   Review of Systems  Physical Exam Updated Vital Signs BP (!) 134/94 (BP Location: Right Arm)   Pulse 81   Temp 97.9 F (36.6 C)   Resp 16   Ht 6' (1.829 m)   Wt 63.5 kg   SpO2 99%   BMI 18.99 kg/m  Physical Exam Vitals and nursing note reviewed.  Constitutional:      General: He is not in acute distress.    Appearance: He is well-developed. He is not diaphoretic.  HENT:     Head: Normocephalic and atraumatic.  Eyes:     General: No scleral icterus.    Conjunctiva/sclera: Conjunctivae normal.  Cardiovascular:     Rate and Rhythm: Normal rate and regular rhythm.     Heart sounds: Normal heart sounds.  Pulmonary:     Effort: Pulmonary effort is normal. No respiratory distress.     Breath sounds: Normal breath sounds.  Abdominal:     Palpations: Abdomen is soft.     Tenderness: There is no abdominal tenderness.  Musculoskeletal:       Arms:     Cervical back: Normal range of motion and neck supple.  Skin:    General: Skin is warm and dry.  Neurological:     Mental Status: He is alert.  Psychiatric:        Behavior: Behavior  normal.    ED Results / Procedures / Treatments   Labs (all labs ordered are listed, but only abnormal results are displayed) Labs Reviewed - No data to display  EKG None  Radiology No results found.  Procedures Procedures    Medications Ordered in ED Medications  lidocaine  (LIDODERM ) 5 % 1 patch (has no administration in time range)    ED Course/ Medical Decision Making/ A&P                                 Medical Decision Making Risk Prescription drug management.   Patient here with acute neck pain and spasm Offered trigger pt injection but declines. No sighs of deep space infection. No injuries, no suggestion of myelopathy or pathologic frx.  Patient tx with antiinflammatories, otc lidocaine , muscle relaxer. Pcp f'u  and return precautions.        Final Clinical Impression(s) / ED Diagnoses Final diagnoses:  None    Rx / DC Orders ED Discharge Orders     None  Tama Fails, PA-C 04/09/24 1837    Lowery Rue, DO 04/12/24 2338
